# Patient Record
Sex: Male | Born: 2005 | Race: White | Hispanic: No | Smoking: Never smoker
Health system: Southern US, Community
[De-identification: ages and names within clinical notes are randomized; demographics above are authoritative.]

## PROBLEM LIST (undated history)

## (undated) DIAGNOSIS — S01551A Open bite of lip, initial encounter: Secondary | ICD-10-CM

## (undated) DIAGNOSIS — J189 Pneumonia, unspecified organism: Secondary | ICD-10-CM

## (undated) DIAGNOSIS — W540XXA Bitten by dog, initial encounter: Secondary | ICD-10-CM

---
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
## 2024-08-06 ENCOUNTER — Encounter: Payer: Self-pay | Admitting: *Deleted
